# Patient Record
Sex: Male | Born: 2005 | Race: Black or African American | Hispanic: No | Marital: Single | Smoking: Never smoker
Health system: Southern US, Community
[De-identification: ages and names within clinical notes are randomized; demographics above are authoritative.]

## PROBLEM LIST (undated history)

## (undated) HISTORY — PX: HERNIA REPAIR: SHX51

---
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
## 2023-02-21 ENCOUNTER — Encounter (HOSPITAL_BASED_OUTPATIENT_CLINIC_OR_DEPARTMENT_OTHER): Payer: Self-pay | Admitting: Emergency Medicine

## 2023-02-21 ENCOUNTER — Other Ambulatory Visit: Payer: Self-pay

## 2023-02-21 ENCOUNTER — Emergency Department (HOSPITAL_BASED_OUTPATIENT_CLINIC_OR_DEPARTMENT_OTHER)
Admission: EM | Admit: 2023-02-21 | Discharge: 2023-02-21 | Disposition: A | Discharge status: Home / Self Care | Payer: Medicaid Other | Attending: Emergency Medicine | Admitting: Emergency Medicine

## 2023-02-21 ENCOUNTER — Emergency Department (HOSPITAL_BASED_OUTPATIENT_CLINIC_OR_DEPARTMENT_OTHER): Payer: Medicaid Other

## 2023-02-21 DIAGNOSIS — W231XXA Caught, crushed, jammed, or pinched between stationary objects, initial encounter: Secondary | ICD-10-CM | POA: Diagnosis not present

## 2023-02-21 DIAGNOSIS — S6992XA Unspecified injury of left wrist, hand and finger(s), initial encounter: Secondary | ICD-10-CM | POA: Diagnosis not present

## 2023-02-21 DIAGNOSIS — M79645 Pain in left finger(s): Secondary | ICD-10-CM | POA: Diagnosis present

## 2023-02-21 NOTE — ED Triage Notes (Signed)
Pt jammed LT index finger on something while doing a water course on Saturday; c/o pain and swelling

## 2023-02-21 NOTE — ED Notes (Signed)

## 2023-02-21 NOTE — ED Provider Notes (Signed)
[facility] Provider Note   CSN: [id number] Arrival date & time: 02/21/23  1851     History Chief Complaint  Patient presents with   Finger Injury    Jorge Drake is a 17 y.o. male reportedly otherwise healthy presents emerged from today for evaluation of left index finger pain.  Patient reports that 2 days ago he was at a water obstacle course when he jammed his finger on something.  He has pain at the MCP of the left index.  He is right-hand dominant.  Reports some tingling but denies any numbness.  No pain into the other fingers, hand, wrist, or arm.  No other injury obtained.  No pain medication tried prior to arrival.  HPI     Home Medications Prior to Admission medications   Not on File      Allergies    Patient has no known allergies.    Review of Systems   Review of Systems  Constitutional:  Negative for chills and fever.  Musculoskeletal:  Positive for arthralgias and myalgias.    Physical Exam Updated Vital Signs BP 125/78 (BP Location: Right Arm)   Pulse 66   Temp 98 F (36.7 C)   Resp 20   Ht 6' (1.829 m)   Wt 68 kg   SpO2 97%   BMI 20.34 kg/m  Physical Exam Vitals and nursing note reviewed.  Constitutional:      General: He is not in acute distress.    Appearance: Normal appearance. He is not ill-appearing or toxic-appearing.  Eyes:     General: No scleral icterus. Pulmonary:     Effort: Pulmonary effort is normal. No respiratory distress.  Musculoskeletal:       Hands:     Comments: Tenderness to the MCP joint on the index finger.  Some swelling noted to the finger but still able to completely flex and extend the finger at all joints and make a fist.  Flexion extension of the finger present with and without resistance.  Cap refill brisk.  Compartments are soft.  Palpable radial pulses.  No snuffbox tenderness.  No overlying skin changes, abrasions, or lacerations noted.  No rash noted.  No  overlying erythema or warmth.  Skin:    General: Skin is dry.  Neurological:     General: No focal deficit present.     Mental Status: He is alert. Mental status is at baseline.  Psychiatric:        Mood and Affect: Mood normal.     ED Results / Procedures / Treatments   Labs (all labs ordered are listed, but only abnormal results are displayed) Labs Reviewed - No data to display  EKG None  Radiology DG Finger Index Left  Result Date: 02/21/2023 CLINICAL DATA:  Injury. EXAM: LEFT INDEX FINGER 2+V COMPARISON:  None Available. FINDINGS: There is no evidence of fracture or dislocation. There is no evidence of arthropathy or other focal bone abnormality. Soft tissues are unremarkable. IMPRESSION: Negative. Electronically Signed   By: Larose Hires D.O.   On: 02/21/2023 19:26    Procedures Procedures   Medications Ordered in ED Medications - No data to display  ED Course/ Medical Decision Making/ A&P                           Medical Decision Making Amount and/or Complexity of Data Reviewed Radiology: ordered.   17 y.o. male presents to  the ER today for evaluation of index finger pain. Differential diagnosis includes but is not limited to sprain, strain, fracture, dislocation, MSK. Vital signs unremarkable. Physical exam as noted above.   XR imaging negative.   Likely has a sprain or strain to the index finger.  Of low surgeon for any septic arthritis there is no overlying erythema or warmth.  Patient otherwise afebrile, not tachycardic nor is he an IV drug user.  He has full flexion extension and is neurovascular intact distally.  Compartments are soft.  X-ray unremarkable.  Will have him follow-up with hand surgery as needed.  Recommended Tylenol ibuprofen for pain.  Finger splint provided for comfort as needed.  We discussed plan at bedside. We discussed strict return precautions and red flag symptoms. The patient verbalized their understanding and agrees to the plan. The  patient is stable and being discharged home in good condition.  Portions of this report may have been transcribed using voice recognition software. Every effort was made to ensure accuracy; however, inadvertent computerized transcription errors may be present.   Final Clinical Impression(s) / ED Diagnoses Final diagnoses:  Injury of finger of left hand, initial encounter    Rx / DC Orders ED Discharge Orders     None         Achille Rich, Cordelia Poche 02/21/23 2055    Ernie Avena, MD 02/21/23 2332

## 2023-02-21 NOTE — Discharge Instructions (Addendum)
You were seen in the ER today for evaluation of your left index finger. Your XR was unremarkable. This is likely a sprain.  I recommend you try 600 mg of ibuprofen and or 1000 mg of Tylenol every 6 hours as needed for pain.  I am providing you a splint, you can use as needed for further support.  I would like you to follow-up with the hand surgeon if you have continued pain.  The information is listed on the discharge paperwork, please call to schedule for an appointment.  If you have any concerns with new or worsening symptoms, please return to the nearest emergency department for evaluation.  Contact a health care provider if: Your pain is not helped by medicine. Your bruising or swelling gets worse. Your splint is damaged. You have a fever. Get help right away if: You lose feeling in your finger. Your finger turns blue. Your finger feels colder than normal when you touch it.
